# Patient Record
Sex: Female | Born: 1960 | Race: Black or African American | Hispanic: No | Marital: Single | Smoking: Current every day smoker
Health system: Southern US, Community
[De-identification: ages and names within clinical notes are randomized; demographics above are authoritative.]

## PROBLEM LIST (undated history)

## (undated) HISTORY — PX: BREAST EXCISIONAL BIOPSY: SUR124

---

## 2011-01-13 ENCOUNTER — Emergency Department: Payer: Self-pay | Admitting: Emergency Medicine

## 2013-12-09 ENCOUNTER — Ambulatory Visit: Payer: Self-pay

## 2015-05-25 ENCOUNTER — Encounter: Payer: Self-pay | Admitting: *Deleted

## 2015-05-25 ENCOUNTER — Ambulatory Visit
Admission: RE | Admit: 2015-05-25 | Discharge: 2015-05-25 | Disposition: A | Discharge status: Home / Self Care | Payer: Self-pay | Source: Ambulatory Visit | Attending: Oncology | Admitting: Oncology

## 2015-05-25 ENCOUNTER — Ambulatory Visit: Payer: Self-pay | Attending: Oncology | Admitting: *Deleted

## 2015-05-25 VITALS — BP 126/80 | HR 90 | Temp 98.6°F | Resp 18 | Ht 66.54 in | Wt 144.2 lb

## 2015-05-25 DIAGNOSIS — N6459 Other signs and symptoms in breast: Secondary | ICD-10-CM

## 2015-05-25 NOTE — Patient Instructions (Signed)
Gave patient hand-out, Women Staying Healthy, Active and Well from [facility], with education on breast health, pap smears, heart and colon health. 

## 2015-05-25 NOTE — Progress Notes (Signed)
Subjective:     Patient ID: Brooke Hall, female   DOB: 12/05/1960, 55 y.o.   MRN: [id number]  HPI   Review of Systems     Objective:   Physical Exam  Pulmonary/Chest: Right breast exhibits no inverted nipple, no mass, no nipple discharge, no skin change and no tenderness. Left breast exhibits inverted nipple. Left breast exhibits no mass, no nipple discharge, no skin change and no tenderness. Breasts are symmetrical.         Assessment:     55 [facility] Black [facility]  On clinical breast exam the left nipple is inverted with a montgomery gland like lesion at 12:00.  Taught self breast awareness.  Patient has been screened for eligibility.  She does not have any insurance, Medicare or Medicaid.  She also meets financial eligibility.  Hand-out given on the Affordable Care Act.     Plan:     Bilateral diagnostic mammogram with ultrasound.  Will follow-up per [facility] protocol.

## 2015-06-14 ENCOUNTER — Telehealth: Payer: Self-pay | Admitting: *Deleted

## 2015-06-14 NOTE — Telephone Encounter (Signed)
Called and discussed the normal mammogram results with the patient.  Questioned if the inverted left nipple was normal for her.  Patient states she is not sure.  States she had noticed that the left nipple was inverted, but has no idea for how long.  Discussed possiblity of returning in a couple of months for a repeat breast exam.  She is agreeable.  Appointment scheduled for her to return on 08/03/15 @ 11:00.

## 2015-08-03 ENCOUNTER — Ambulatory Visit: Payer: BLUE CROSS/BLUE SHIELD | Attending: Oncology

## 2015-08-03 VITALS — BP 118/70 | HR 90 | Temp 98.3°F | Resp 20

## 2015-08-03 DIAGNOSIS — N63 Unspecified lump in unspecified breast: Secondary | ICD-10-CM

## 2015-08-03 NOTE — Progress Notes (Signed)
Patient returned for evaluation on enlarged Montgomery gland noted on January 2017 exam.  No abnormality noted on today's exam.

## 2016-09-19 ENCOUNTER — Ambulatory Visit: Payer: Self-pay | Attending: Oncology | Admitting: *Deleted

## 2016-09-19 ENCOUNTER — Ambulatory Visit
Admission: RE | Admit: 2016-09-19 | Discharge: 2016-09-19 | Disposition: A | Discharge status: Home / Self Care | Payer: Self-pay | Source: Ambulatory Visit | Attending: Oncology | Admitting: Oncology

## 2016-09-19 ENCOUNTER — Encounter: Payer: Self-pay | Admitting: *Deleted

## 2016-09-19 VITALS — BP 120/78 | HR 98 | Temp 98.5°F | Resp 16 | Ht 67.0 in | Wt 146.9 lb

## 2016-09-19 DIAGNOSIS — Z0000 Encounter for general adult medical examination without abnormal findings: Secondary | ICD-10-CM | POA: Insufficient documentation

## 2016-09-19 NOTE — Progress Notes (Signed)
Subjective:     Patient ID: Brooke Hall, female   DOB: 04/14/1961, 56 y.o.   MRN: [id number]  HPI   Review of Systems     Objective:   Physical Exam  Pulmonary/Chest: Right breast exhibits no inverted nipple, no mass, no nipple discharge, no skin change and no tenderness. Left breast exhibits inverted nipple. Left breast exhibits no mass, no nipple discharge, no skin change and no tenderness. Breasts are symmetrical.         Assessment:     56 [facility]  Clinical breast exam with same enlarged montgomery's gland at 12:00 left breast.  Taught self breast awareness.  She is to call if she notices any breast changes.  Patient states [facility]. Patient has been screened for eligibility.  She does not have any insurance, Medicare or Medicaid.  She also meets financial eligibility.  Hand-out given on the Affordable Care Act.     Plan:     Screening mammogram ordered.  Will follow-up per [facility] protocol.

## 2016-09-19 NOTE — Patient Instructions (Signed)
Gave patient hand-out, Women Staying Healthy, Active and Well from [facility], with education on breast health, pap smears, heart and colon health. 

## 2016-09-25 ENCOUNTER — Encounter: Payer: Self-pay | Admitting: *Deleted

## 2016-09-25 NOTE — Progress Notes (Signed)
Letter mailed from the [facility] to inform patient of her normal mammogram results.  Patient is to follow-up with annual screening in one year.  [facility]IS to Christy. 

## 2021-03-14 DEATH — deceased
